# Patient Record
Sex: Female | Born: 1995 | Race: Black or African American | Hispanic: No | Marital: Single | Smoking: Never smoker
Health system: Southern US, Community
[De-identification: ages and names within clinical notes are randomized; demographics above are authoritative.]

## PROBLEM LIST (undated history)

## (undated) DIAGNOSIS — I639 Cerebral infarction, unspecified: Secondary | ICD-10-CM

---

## 2014-05-19 ENCOUNTER — Encounter (HOSPITAL_COMMUNITY): Payer: Self-pay | Admitting: Emergency Medicine

## 2014-05-19 ENCOUNTER — Emergency Department (INDEPENDENT_AMBULATORY_CARE_PROVIDER_SITE_OTHER)
Admission: EM | Admit: 2014-05-19 | Discharge: 2014-05-19 | Disposition: A | Discharge status: Home / Self Care | Payer: Medicaid - Out of State | Source: Home / Self Care | Attending: Emergency Medicine | Admitting: Emergency Medicine

## 2014-05-19 DIAGNOSIS — N390 Urinary tract infection, site not specified: Secondary | ICD-10-CM

## 2014-05-19 HISTORY — DX: Cerebral infarction, unspecified: I63.9

## 2014-05-19 LAB — POCT URINALYSIS DIP (DEVICE)
BILIRUBIN URINE: NEGATIVE
GLUCOSE, UA: 100 mg/dL — AB
Ketones, ur: NEGATIVE mg/dL
Nitrite: POSITIVE — AB
PH: 7 (ref 5.0–8.0)
Protein, ur: >=300 mg/dL — AB
Specific Gravity, Urine: 1.025 (ref 1.005–1.030)
UROBILINOGEN UA: 1 mg/dL (ref 0.0–1.0)

## 2014-05-19 LAB — POCT PREGNANCY, URINE: Preg Test, Ur: NEGATIVE

## 2014-05-19 MED ORDER — CEPHALEXIN 500 MG PO CAPS: 500.0000 mg | ORAL_CAPSULE | Freq: Four times a day (QID) | ORAL | Status: AC

## 2014-05-19 NOTE — Discharge Instructions (Signed)

## 2014-05-19 NOTE — ED Notes (Signed)
Patient c/o low abdominal pain and blood in urine and pain at the end of urinary stream.  History of uti

## 2014-05-19 NOTE — ED Provider Notes (Signed)
CSN: [id number]     Arrival date & time 05/19/14  1730 History   First MD Initiated Contact with Patient 05/19/14 1821     Chief Complaint  Patient presents with  . Urinary Tract Infection   (Consider location/radiation/quality/duration/timing/severity/associated sxs/prior Treatment) HPI Comments: 19 year old female complaining of dysuria, gross hematuria, urinary frequency and urine straining dribbling for the past 2-3 days. Sometimes she has a pressure or uncomfortable feeling in the suprapubic area. She states her LMP was in 2014 but is taking daily OCPs.   Past Medical History  Diagnosis Date  . Stroke    History reviewed. No pertinent past surgical history. No family history on file. History  Substance Use Topics  . Smoking status: Never Smoker   . Smokeless tobacco: Not on file  . Alcohol Use: No   OB History    No data available     Review of Systems  Constitutional: Negative.  Negative for fever and fatigue.  HENT: Negative.   Respiratory: Negative.   Cardiovascular: Negative.   Gastrointestinal: Negative.   Genitourinary: Positive for dysuria, urgency, frequency, hematuria and difficulty urinating. Negative for flank pain, vaginal discharge and vaginal pain.    Allergies  Review of patient's allergies indicates no known allergies.  Home Medications   Prior to Admission medications   Medication Sig Start Date End Date Taking? Authorizing Provider  OVER THE COUNTER MEDICATION Urinary tract pain relief pills   Yes Historical Provider, MD  cephALEXin (KEFLEX) 500 MG capsule Take 1 capsule (500 mg total) by mouth 4 (four) times daily. 05/19/14   Hayden Rasmussenavid Aldea Avis, NP   BP 103/72 mmHg  Pulse 66  Temp(Src) 98.8 F (37.1 C) (Oral)  Resp 16  SpO2 100%  LMP 02/16/2013 Physical Exam  Constitutional: She is oriented to person, place, and time. She appears well-developed and well-nourished. No distress.  Pulmonary/Chest: Effort normal. No respiratory distress.  Abdominal:  Soft. There is no tenderness.  Neurological: She is alert and oriented to person, place, and time.  Skin: Skin is warm and dry.  Nursing note and vitals reviewed.   ED Course  Procedures (including critical care time) Labs Review Labs Reviewed  POCT URINALYSIS DIP (DEVICE) - Abnormal; Notable for the following:    Glucose, UA 100 (*)    Hgb urine dipstick LARGE (*)    Protein, ur >=300 (*)    Nitrite POSITIVE (*)    Leukocytes, UA LARGE (*)    All other components within normal limits  URINE CULTURE  POCT PREGNANCY, URINE    Imaging Review No results found.   MDM   1. UTI (lower urinary tract infection)    Urine culture pending Keflex 500 mg 4 times a day Continue the AZO Lots of water liquids    Hayden Rasmussenavid Jovan Colligan, NP 05/19/14 [phone]

## 2014-05-21 LAB — URINE CULTURE: Special Requests: NORMAL

## 2014-05-23 NOTE — ED Notes (Signed)
Urine culture: 20,000 colonies E. Coli.  Pt. adequately treated with Keflex. Vassie MoselleYork, Dayshia Ballinas M 05/23/2014

## 2015-07-12 ENCOUNTER — Other Ambulatory Visit: Payer: Self-pay | Admitting: Gastroenterology

## 2015-07-12 ENCOUNTER — Telehealth: Payer: Self-pay | Admitting: Oncology

## 2015-07-12 ENCOUNTER — Encounter: Payer: Self-pay | Admitting: Oncology

## 2015-07-12 DIAGNOSIS — R7989 Other specified abnormal findings of blood chemistry: Secondary | ICD-10-CM

## 2015-07-12 DIAGNOSIS — R945 Abnormal results of liver function studies: Principal | ICD-10-CM

## 2015-07-12 NOTE — Telephone Encounter (Signed)
Received referral 3/20 but no contact with pt. Contacted referring office and left mess regarding inablility to reach pt and referral has been cancelled at this time.

## 2015-07-15 ENCOUNTER — Telehealth: Payer: Self-pay | Admitting: Oncology

## 2015-07-15 NOTE — Telephone Encounter (Signed)
Pt called in to schedule her appt and confirmed appt date and time

## 2015-07-30 ENCOUNTER — Ambulatory Visit: Payer: Medicaid - Out of State | Admitting: Oncology

## 2015-07-30 ENCOUNTER — Ambulatory Visit: Payer: BLUE CROSS/BLUE SHIELD | Admitting: Oncology

## 2015-08-04 ENCOUNTER — Telehealth: Payer: Self-pay | Admitting: Oncology

## 2015-08-04 NOTE — Telephone Encounter (Signed)
Lt mess for pt regarding her upcoming appt.

## 2015-08-20 ENCOUNTER — Ambulatory Visit (HOSPITAL_BASED_OUTPATIENT_CLINIC_OR_DEPARTMENT_OTHER): Payer: BLUE CROSS/BLUE SHIELD | Admitting: Hematology

## 2015-08-20 ENCOUNTER — Encounter: Payer: Self-pay | Admitting: Hematology

## 2015-08-20 ENCOUNTER — Ambulatory Visit
Admission: RE | Admit: 2015-08-20 | Discharge: 2015-08-20 | Disposition: A | Discharge status: Home / Self Care | Payer: BLUE CROSS/BLUE SHIELD | Source: Ambulatory Visit | Attending: Gastroenterology | Admitting: Gastroenterology

## 2015-08-20 ENCOUNTER — Telehealth: Payer: Self-pay | Admitting: Hematology

## 2015-08-20 VITALS — BP 102/62 | HR 56 | Temp 98.4°F | Resp 18 | Ht 66.0 in | Wt 122.8 lb

## 2015-08-20 DIAGNOSIS — R7989 Other specified abnormal findings of blood chemistry: Secondary | ICD-10-CM

## 2015-08-20 DIAGNOSIS — D696 Thrombocytopenia, unspecified: Secondary | ICD-10-CM | POA: Diagnosis not present

## 2015-08-20 DIAGNOSIS — R945 Abnormal results of liver function studies: Principal | ICD-10-CM

## 2015-08-20 DIAGNOSIS — D72820 Lymphocytosis (symptomatic): Secondary | ICD-10-CM

## 2015-08-20 NOTE — Progress Notes (Signed)
[name] Kitchen.    HEMATOLOGY/ONCOLOGY CONSULTATION NOTE  Date of Service: 08/20/2015  PCP:  Robin Mccarty CHIEF COMPLAINTS/PURPOSE OF CONSULTATION:   thrombocytopenia  HISTORY OF PRESENTING ILLNESS:   Robin Mccarty is a wonderful 20 y.o. female who has been referred to us by Robin Mccarty  for evaluation and management of  Thrombocytopenia.   She has no history of chronic medical issues. [facility]   Patient notes that she  Was noted to have an Escherichia coli urinary tract infection in March 2017 and was prescribed nitrofurantoin.   she reports having had a urinary tract infection in September last year as well And received antibiotics but isn't sure which ones.   She had,  On 07/05/2015- A CBC which showed a  A hemoglobin of 11.9 with a normal MCV of 87.6 and a normal RDW of 13.6.  WBC count 10.1k with 61% lymphocytes.  Platelet counts were noted to be lower at 110k.  Smear  Review of the pathology technician suggested atypical lymphocytes. Pathologist review of peripheral blood smear showed atypical lymphs with absolute lymphocytosis. Favor reactive process. Thrombocytopenia. No platelet clumps.   CMP  Was within normal limits other than an elevated AST of 99 and ALT of 97.    Patient reports  Never being aware of having low platelets in the past.   Patient notes no fevers or chills. No enlarged lymph nodes. No night sweats. No unexpected weight loss. No fatigue.  Notes that she is currently feeling quite well.  she has noted no increased bruising or bleeding. No epistaxis. No gum bleeds. No GI bleed. No hematuria. No skin rashes.  no reported unsafe sexual exposure.  No IV or other drugs.  MEDICAL HISTORY:   no chronic medical problems  SURGICAL HISTORY:  no previous surgeries.  SOCIAL HISTORY: Social History   Social History  . Marital Status: Single    Spouse Name: N/A  . Number of  Children: N/A  . Years of Education: N/A   Occupational History  . Not on file.   Social History Main Topics  . Smoking status: Never Smoker   . Smokeless tobacco: Not on file  . Alcohol Use: No  . Drug Use: No  . Sexual Activity: Not on file   Other Topics Concern  . Not on file   Social History Narrative   student at [employer]  FAMILY HISTORY: History reviewed. No pertinent family history.  ALLERGIES:  has No Known Allergies.  MEDICATIONS:  Current Outpatient Prescriptions  Medication Sig Dispense Refill  . cephALEXin (KEFLEX) 500 MG capsule Take 1 capsule (500 mg total) by mouth 4 (four) times daily. 28 capsule 0  . OVER THE COUNTER MEDICATION Urinary tract pain relief pills     No current facility-administered medications for this visit.   Nexplanon  For nearly 3 years  REVIEW OF SYSTEMS:    10 Point review of Systems was done is negative except as noted above.  PHYSICAL EXAMINATION: ECOG PERFORMANCE STATUS: 0 - Asymptomatic  . Filed Vitals:   08/20/15 0924  BP: 102/62  Pulse: 56  Temp: 98.4 F (36.9 C)  Resp: 18   Filed Weights   08/20/15 0924  Weight: 122 lb 12.8 oz (55.702 kg)   .Body mass index is 19.83 kg/(m^2).  GENERAL:alert, in no acute distress and comfortable SKIN: skin color, texture, turgor are normal, no rashes or significant lesions  EYES: normal, conjunctiva are pink and non-injected, sclera clear OROPHARYNX:no exudate, no erythema and lips, buccal mucosa, and tongue normal  NECK: supple, no JVD, thyroid normal size, non-tender, without nodularity LYMPH:  no palpable lymphadenopathy in the cervical, axillary or inguinal LUNGS: clear to auscultation with normal respiratory effort HEART: regular rate & rhythm,  no murmurs and no lower extremity edema ABDOMEN: abdomen soft, non-tender, normoactive bowel sounds  Musculoskeletal: no cyanosis of digits and no clubbing  PSYCH: alert & oriented x 3 with fluent  speech NEURO: no focal motor/sensory deficits  LABORATORY DATA:  I have reviewed the data as listed  outside labs reviewed   RADIOGRAPHIC STUDIES: I have personally reviewed the radiological images as listed and agreed with the findings in the report. US Abdomen Complete  08/20/2015  CLINICAL DATA:  Elevated liver function tests and chronic nausea. Clinical concern for splenomegaly. EXAM: ABDOMEN ULTRASOUND COMPLETE COMPARISON:  None. FINDINGS: Gallbladder: No gallstones or wall thickening visualized. No sonographic Murphy sign noted by sonographer. Common bile duct: Diameter: 2 mm Liver: No focal lesion identified. Within normal limits in parenchymal echogenicity. IVC: No abnormality visualized. Pancreas: Visualized portion unremarkable. Spleen: Size and appearance within normal limits. Craniocaudal splenic length 9.2 cm. Right Kidney: Length: 11.1 cm. Echogenicity within normal limits. No mass or hydronephrosis visualized. Left Kidney: Length: 10.0 cm. Echogenicity within normal limits. No mass or hydronephrosis visualized. Abdominal aorta: No aneurysm visualized. Other findings: None. IMPRESSION: Normal abdominal sonogram. Normal size spleen. No cholelithiasis. No biliary ductal dilatation . Electronically Signed   By: Delbert Phenix M.D.   On: 08/20/2015 11:44    ASSESSMENT & PLAN:    20 year old African-American female  Who is a Archivist at [employer] with    #1  Mild thrombocytopenia.  No overt platelet clumping based on pathologist review of smear.  Noted to have lymphocytosis likely reactive.  Ports having had flulike symptoms with throat pain myalgias  About a month ago which has not resolved.  Could have had a viral infection that would explain her  Thrombocytopenia , lymphocytosis and abnormal LFTs.  normal spleen size on ultrasound of the abdomen.  could be from nitrofurantoin use.   #2  Elevated LFTs.  Predominantly AST and ALT.  Patient denies  alcohol use or any other hepatotoxic medication use.  Had an ultrasound of the abdomen done on 08/20/2015 that showed no evidence of hepatobiliary pathology.  Could certainly be from nitrofurantoin use or  Recent viral upper respiratory infection. Plan  - Labs were ordered including repeat CBC, CMP , HIV, hepatitis C - I will personally review peripheral blood smear - LDH - B12 and TSH - Patient noted that she wanted to draw labs later and will come back on Monday.  . Orders Placed This Encounter  Procedures  . CBC & Diff and Retic    Standing Status: Future     Number of Occurrences:      Standing Expiration Date: 09/23/2016  . Comprehensive metabolic panel    Standing Status: Future     Number of Occurrences:      Standing Expiration Date: 08/19/2016  . Sedimentation rate    Standing Status: Future     Number of Occurrences:      Standing Expiration Date: 08/19/2016  . Smear    Standing Status: Future     Number of Occurrences:      Standing Expiration Date: 08/19/2016  . Lactate dehydrogenase (LDH)    Standing Status: Future  Number of Occurrences:      Standing Expiration Date: 08/19/2016  . TSH    Standing Status: Future     Number of Occurrences:      Standing Expiration Date: 08/19/2016  . Vitamin B12    Standing Status: Future     Number of Occurrences:      Standing Expiration Date: 08/19/2016  . Hepatitis C antibody    Standing Status: Future     Number of Occurrences:      Standing Expiration Date: 08/19/2016  . HIV antibody (with reflex)    Standing Status: Future     Number of Occurrences:      Standing Expiration Date: 08/19/2016     All of the patients questions were answered with apparent satisfaction. The patient knows to call the clinic with any problems, questions or concerns.  I spent 45 minutes counseling the patient face to face. The total time spent in the appointment was 45 minutes and more than 50% was on counseling and direct patient cares.    Wyvonnia Lora MD MS AAHIVMS [facility] Hematology/Oncology Physician [facility]  (Office):       [phone] (Work cell):  [phone] (Fax):           [phone]  08/20/2015 9:34 AM

## 2015-08-20 NOTE — Telephone Encounter (Signed)
Gave pt appt & avs °

## 2015-08-22 ENCOUNTER — Encounter: Payer: Self-pay | Admitting: Hematology

## 2015-08-23 ENCOUNTER — Other Ambulatory Visit: Payer: BLUE CROSS/BLUE SHIELD

## 2015-08-27 ENCOUNTER — Other Ambulatory Visit (HOSPITAL_BASED_OUTPATIENT_CLINIC_OR_DEPARTMENT_OTHER): Payer: BLUE CROSS/BLUE SHIELD

## 2015-08-27 DIAGNOSIS — D696 Thrombocytopenia, unspecified: Secondary | ICD-10-CM

## 2015-08-27 DIAGNOSIS — R7989 Other specified abnormal findings of blood chemistry: Secondary | ICD-10-CM | POA: Diagnosis not present

## 2015-08-27 LAB — CBC & DIFF AND RETIC
BASO%: 0.1 % (ref 0.0–2.0)
Basophils Absolute: 0 10*3/uL (ref 0.0–0.1)
EOS%: 3.3 % (ref 0.0–7.0)
Eosinophils Absolute: 0.3 10*3/uL (ref 0.0–0.5)
HCT: 39.6 % (ref 34.8–46.6)
HGB: 13.1 g/dL (ref 11.6–15.9)
Immature Retic Fract: 0 % — ABNORMAL LOW (ref 1.60–10.00)
LYMPH#: 1.8 10*3/uL (ref 0.9–3.3)
LYMPH%: 20.1 % (ref 14.0–49.7)
MCH: 29.9 pg (ref 25.1–34.0)
MCHC: 33.1 g/dL (ref 31.5–36.0)
MCV: 90.4 fL (ref 79.5–101.0)
MONO#: 0.5 10*3/uL (ref 0.1–0.9)
MONO%: 6.2 % (ref 0.0–14.0)
NEUT#: 6.1 10*3/uL (ref 1.5–6.5)
NEUT%: 70.3 % (ref 38.4–76.8)
PLATELETS: 194 10*3/uL (ref 145–400)
RBC: 4.38 10*6/uL (ref 3.70–5.45)
RDW: 12.5 % (ref 11.2–14.5)
Retic %: <0.38 % — ABNORMAL LOW (ref 0.5–1.5)
Retic Ct Abs: 15.77 10*3/uL — ABNORMAL LOW (ref 33.70–90.70)
WBC: 8.7 10*3/uL (ref 3.9–10.3)

## 2015-08-27 LAB — COMPREHENSIVE METABOLIC PANEL
ALT: 17 U/L (ref 0–55)
ANION GAP: 9 meq/L (ref 3–11)
AST: 29 U/L (ref 5–34)
Albumin: 4.4 g/dL (ref 3.5–5.0)
Alkaline Phosphatase: 91 U/L (ref 40–150)
BILIRUBIN TOTAL: 0.45 mg/dL (ref 0.20–1.20)
BUN: 11.7 mg/dL (ref 7.0–26.0)
CO2: 26 mEq/L (ref 22–29)
Calcium: 9.8 mg/dL (ref 8.4–10.4)
Chloride: 108 mEq/L (ref 98–109)
Creatinine: 0.9 mg/dL (ref 0.6–1.1)
EGFR: >90 mL/min/{1.73_m2} (ref >90)
Glucose: 63 mg/dl — ABNORMAL LOW (ref 70–140)
POTASSIUM: 3.9 meq/L (ref 3.5–5.1)
Sodium: 143 mEq/L (ref 136–145)
Total Protein: 7.7 g/dL (ref 6.4–8.3)

## 2015-08-27 LAB — TSH: TSH: 0.891 m[IU]/L (ref 0.308–3.960)

## 2015-08-27 LAB — LACTATE DEHYDROGENASE: LDH: 209 U/L (ref 125–245)

## 2015-08-27 LAB — CHCC SMEAR

## 2015-08-28 LAB — VITAMIN B12: Vitamin B12: 840 pg/mL (ref 211–946)

## 2015-08-28 LAB — SEDIMENTATION RATE: Sedimentation Rate-Westergren: 2 mm/hr (ref 0–32)

## 2015-08-28 LAB — HEPATITIS C ANTIBODY

## 2015-08-28 LAB — HIV ANTIBODY (ROUTINE TESTING W REFLEX): HIV Screen 4th Generation wRfx: NONREACTIVE

## 2017-07-22 IMAGING — US US ABDOMEN COMPLETE
1 series · 14 of 25 positions shown · non-contrast
Comparison: None.

CLINICAL DATA: Elevated liver function tests and chronic nausea.
Clinical concern for splenomegaly.

EXAM:
ABDOMEN ULTRASOUND COMPLETE

[Series 1: us abdomen complete · 0.17mm/px · 14 of 84 slices shown]
[im 1/84]
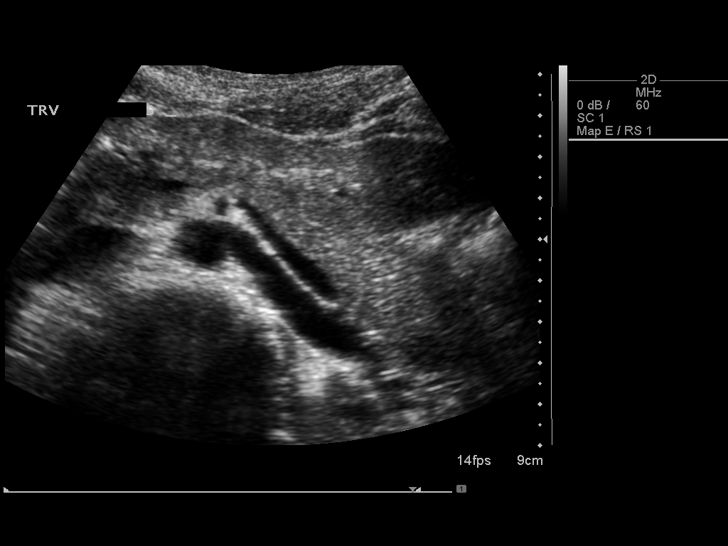
[im 7/84]
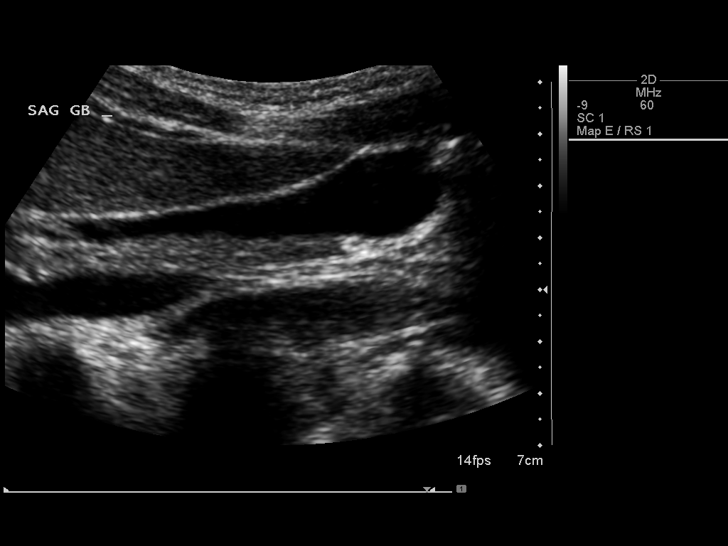
[im 14/84]
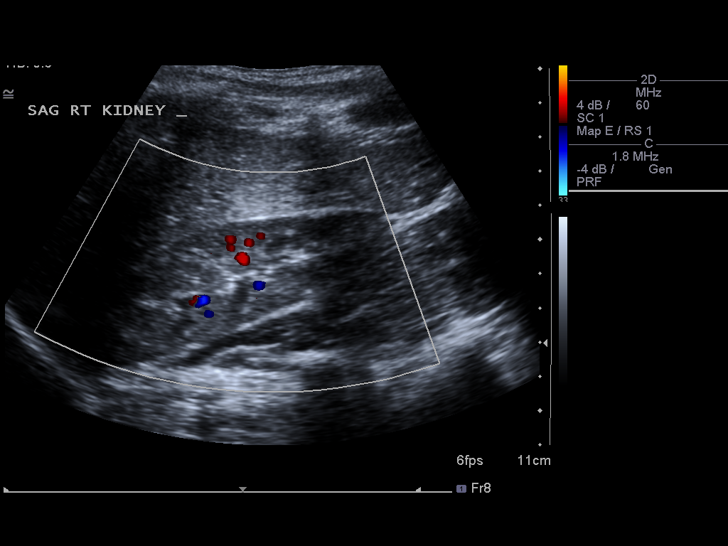
[im 21/84]
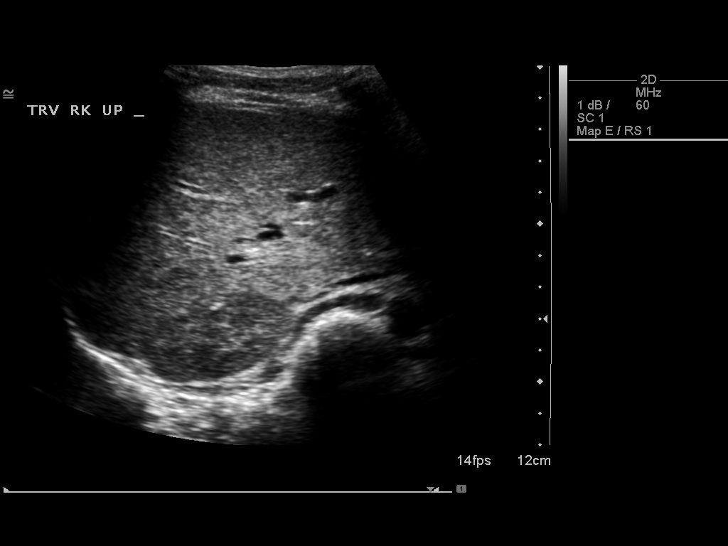
[im 28/84]
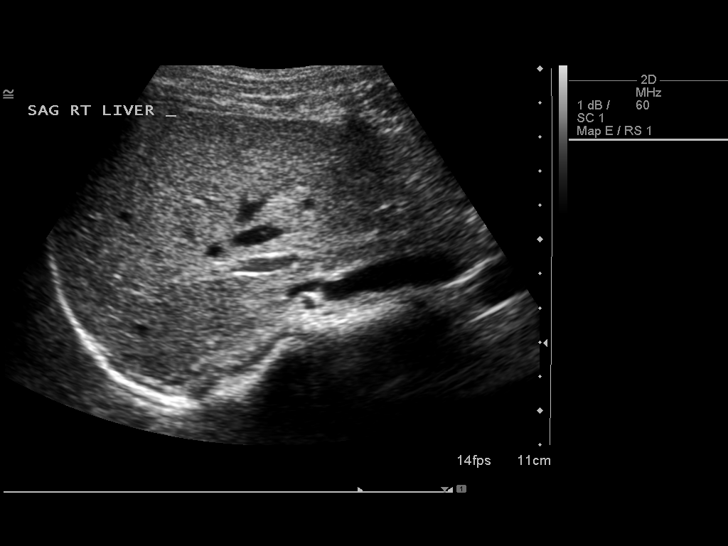
[im 32/84]
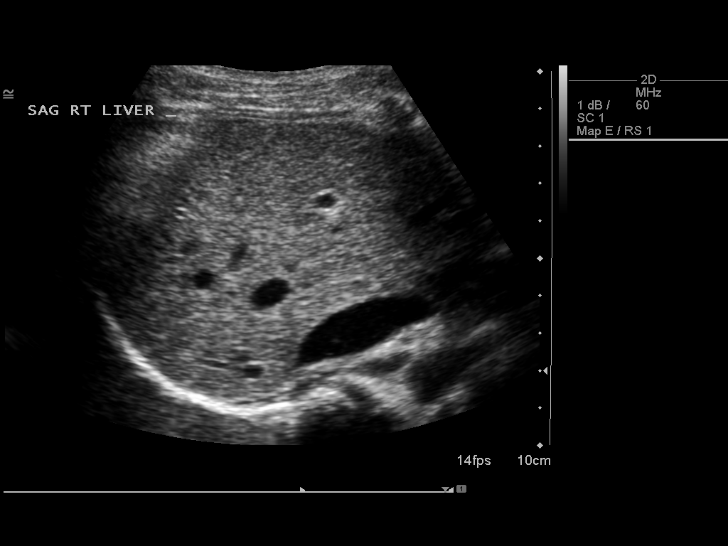
[im 39/84]
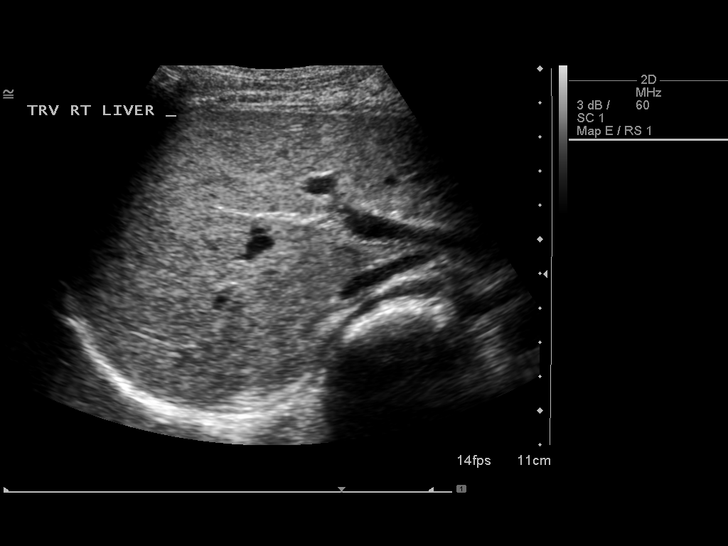
[im 45/84]
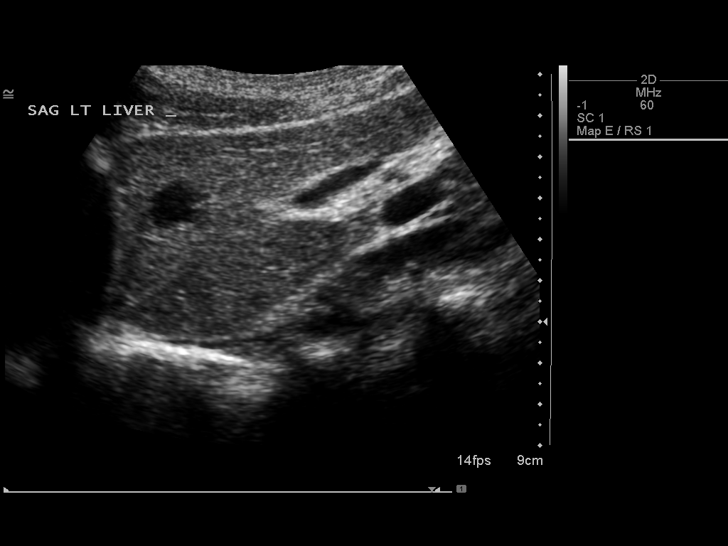
[im 52/84]
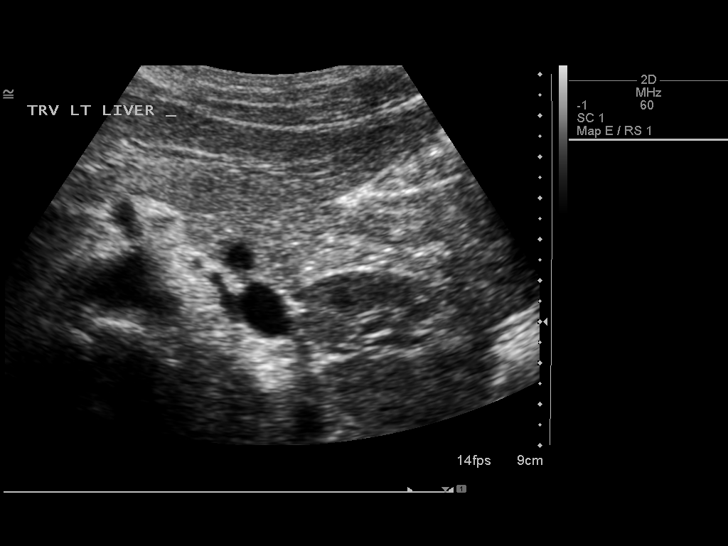
[im 56/84]
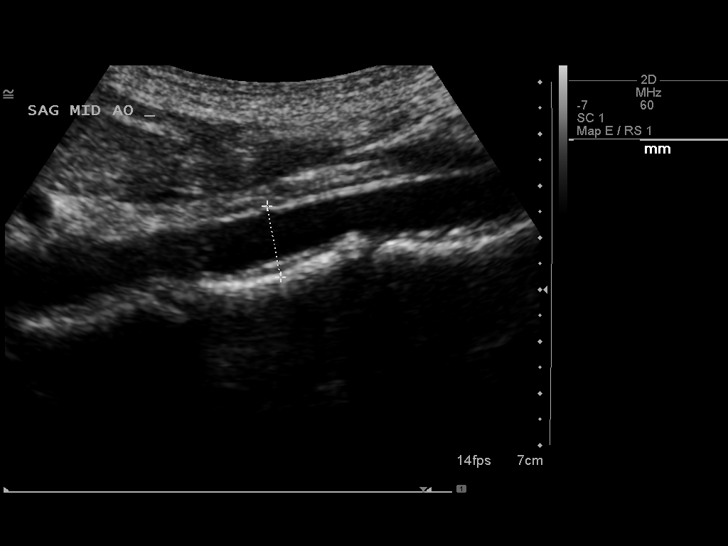
[im 63/84]
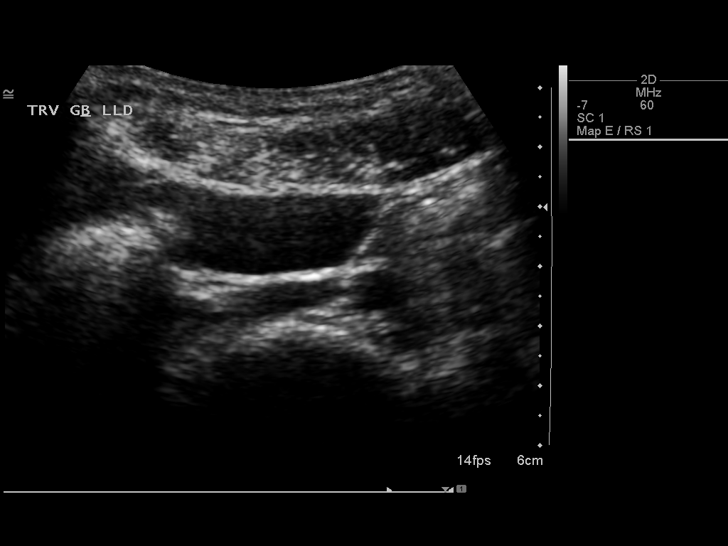
[im 70/84]
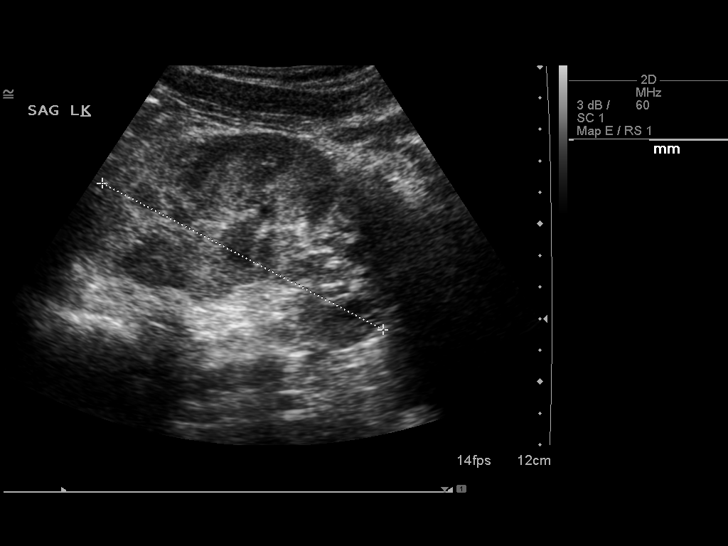
[im 77/84]
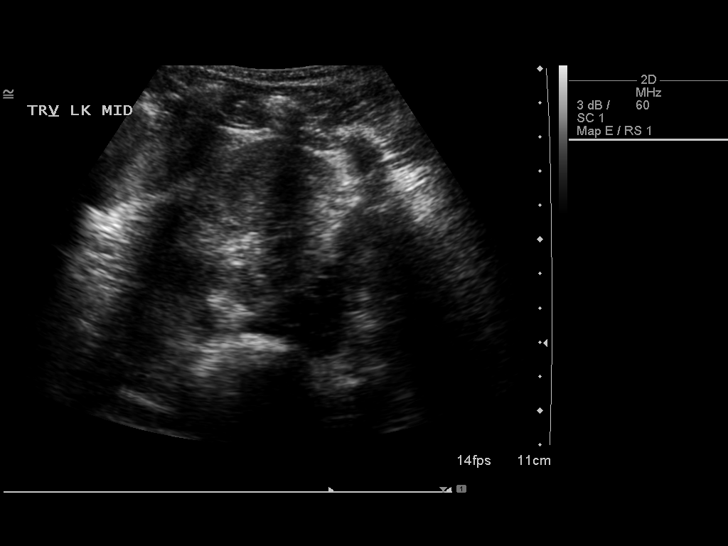
[im 84/84]
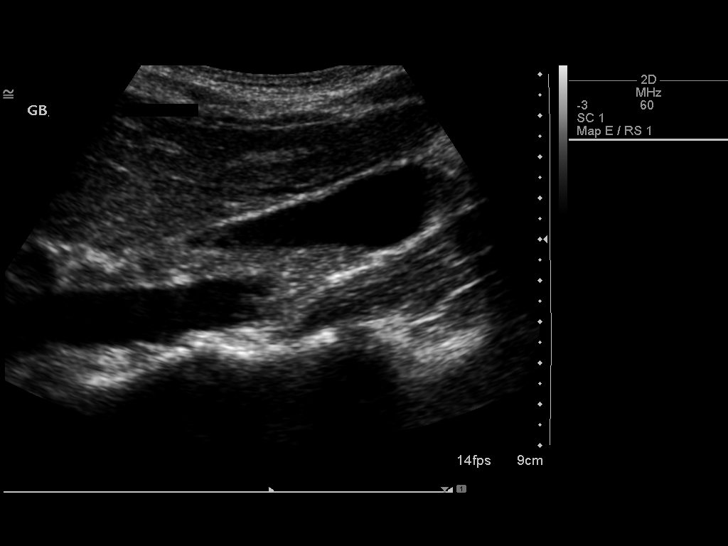

[14 of 25 positions shown; findings below may reference images not displayed]

FINDINGS: Gallbladder: No gallstones or wall thickening visualized. No
sonographic Murphy sign noted by sonographer.

Common bile duct: Diameter: 2 mm

Liver: No focal lesion identified. Within normal limits in
parenchymal echogenicity.

IVC: No abnormality visualized.

Pancreas: Visualized portion unremarkable.

Spleen: Size and appearance within normal limits. Craniocaudal
splenic length 9.2 cm.

Right Kidney: Length: 11.1 cm. Echogenicity within normal limits. No
mass or hydronephrosis visualized.

Left Kidney: Length: 10.0 cm. Echogenicity within normal limits. No
mass or hydronephrosis visualized.

Abdominal aorta: No aneurysm visualized.

Other findings: None.
IMPRESSION: Normal abdominal sonogram. Normal size spleen. No cholelithiasis. No
biliary ductal dilatation .
